# Patient Record
Sex: Male | Born: 1960 | Race: White | Hispanic: No | Marital: Single
Health system: Southern US, Community
[De-identification: ages and names within clinical notes are randomized; demographics above are authoritative.]

---

## 2003-03-10 ENCOUNTER — Other Ambulatory Visit: Payer: Self-pay

## 2003-03-12 ENCOUNTER — Other Ambulatory Visit: Payer: Self-pay

## 2003-03-13 ENCOUNTER — Other Ambulatory Visit: Payer: Self-pay

## 2004-06-04 ENCOUNTER — Emergency Department: Payer: Self-pay | Admitting: Unknown Physician Specialty

## 2004-06-04 ENCOUNTER — Other Ambulatory Visit: Payer: Self-pay

## 2004-06-05 ENCOUNTER — Other Ambulatory Visit: Payer: Self-pay

## 2004-06-17 ENCOUNTER — Ambulatory Visit: Payer: Self-pay | Admitting: Cardiology

## 2004-10-14 ENCOUNTER — Emergency Department: Payer: Self-pay | Admitting: Emergency Medicine

## 2005-06-04 ENCOUNTER — Ambulatory Visit: Payer: Self-pay | Admitting: Cardiology

## 2005-10-26 ENCOUNTER — Emergency Department (HOSPITAL_COMMUNITY): Admission: EM | Admit: 2005-10-26 | Discharge: 2005-10-26 | Payer: Self-pay | Admitting: Emergency Medicine

## 2006-07-12 ENCOUNTER — Emergency Department: Payer: Self-pay | Admitting: Internal Medicine

## 2006-07-13 ENCOUNTER — Inpatient Hospital Stay: Payer: Self-pay

## 2006-09-03 ENCOUNTER — Observation Stay: Payer: Self-pay | Admitting: Internal Medicine

## 2006-09-03 ENCOUNTER — Other Ambulatory Visit: Payer: Self-pay

## 2006-11-05 ENCOUNTER — Other Ambulatory Visit: Payer: Self-pay

## 2006-11-05 ENCOUNTER — Ambulatory Visit: Payer: Self-pay | Admitting: Cardiology

## 2006-11-05 ENCOUNTER — Inpatient Hospital Stay: Payer: Self-pay | Admitting: Cardiology

## 2006-11-07 ENCOUNTER — Other Ambulatory Visit: Payer: Self-pay

## 2006-11-08 ENCOUNTER — Other Ambulatory Visit: Payer: Self-pay

## 2006-11-17 ENCOUNTER — Other Ambulatory Visit: Payer: Self-pay

## 2006-11-17 ENCOUNTER — Ambulatory Visit: Payer: Self-pay | Admitting: Cardiology

## 2006-12-24 ENCOUNTER — Ambulatory Visit: Payer: Self-pay | Admitting: Cardiology

## 2006-12-24 ENCOUNTER — Other Ambulatory Visit: Payer: Self-pay

## 2007-01-09 ENCOUNTER — Emergency Department: Payer: Self-pay | Admitting: Emergency Medicine

## 2007-01-09 ENCOUNTER — Other Ambulatory Visit: Payer: Self-pay

## 2007-11-23 ENCOUNTER — Inpatient Hospital Stay: Payer: Self-pay | Admitting: Internal Medicine

## 2008-05-23 ENCOUNTER — Ambulatory Visit: Payer: Self-pay | Admitting: Internal Medicine

## 2008-06-11 ENCOUNTER — Ambulatory Visit: Payer: Self-pay | Admitting: Internal Medicine

## 2008-06-16 ENCOUNTER — Ambulatory Visit: Payer: Self-pay | Admitting: Internal Medicine

## 2008-07-26 ENCOUNTER — Inpatient Hospital Stay: Payer: Self-pay | Admitting: Psychiatry

## 2009-07-03 IMAGING — CR DG RIBS 2V*R*
1 series · 4 of 4 positions shown · non-contrast
Comparison: none

REASON FOR EXAM: trauma
COMMENTS:

[Series 1: view not recorded · 0.17mm/px · 4 of 4 slices shown]
[im 1/4]
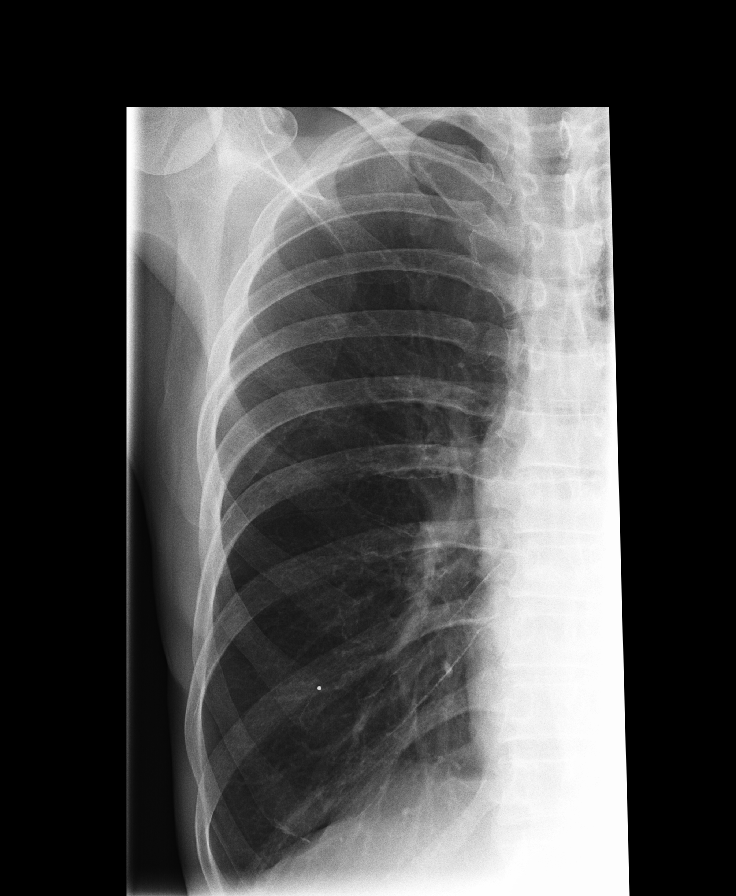
[im 2/4]
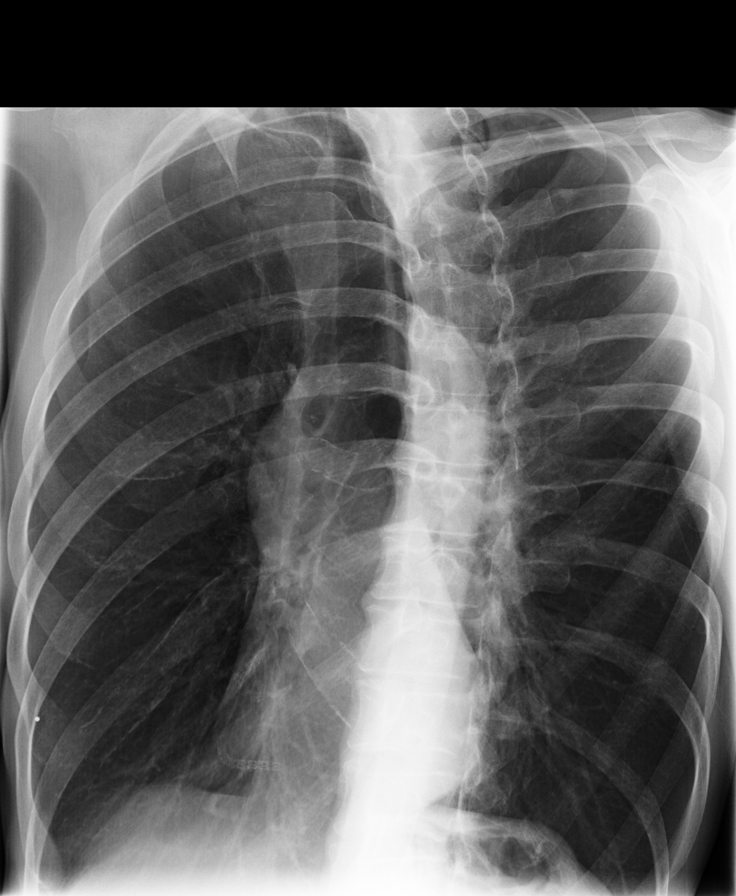
[im 3/4]
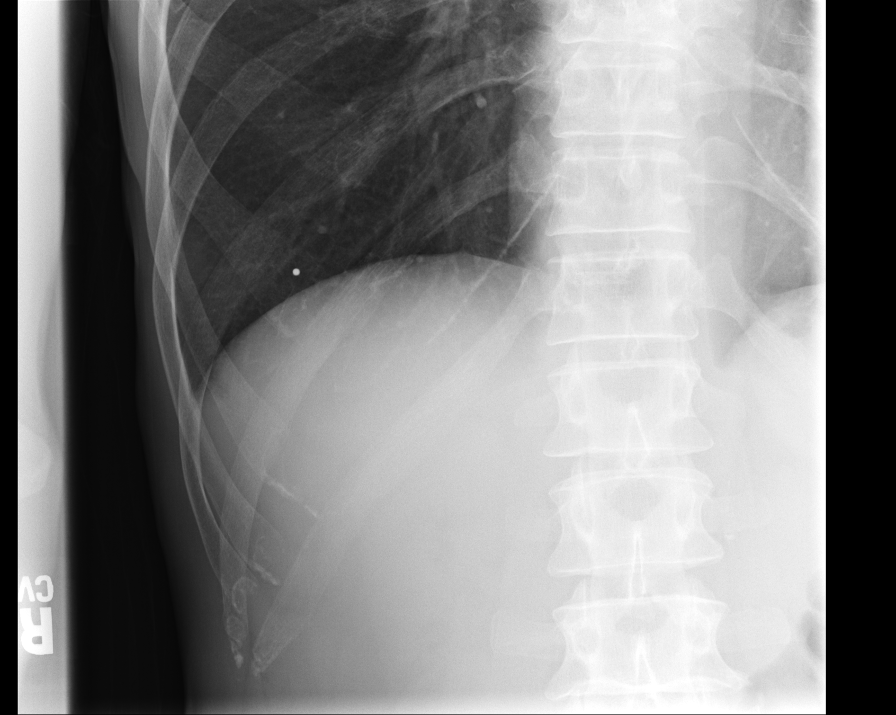
[im 4/4]
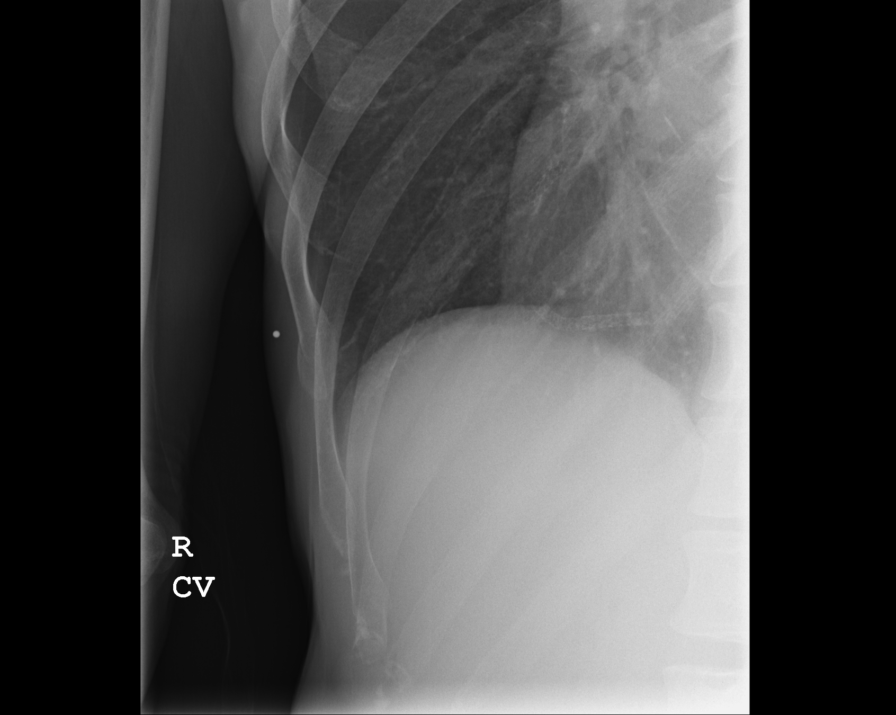

[4 of 4 positions shown; findings below may reference images not displayed]

PROCEDURE:     MDR - MDR RIBS RIGHT UNLILATERAL  - May 23, 2008 [DATE]

RESULT:     Multiple views of the ribs were obtained. In one view, there is
slight deformity of the lateral cortical margin of the seventh right rib.
This may represent an incomplete fracture. The finding, however, is not
identified in other views and may be entirely spurious. No other areas
suspicious for fracture are identified.
IMPRESSION: Possible nondisplaced fracture of the seventh left rib anteriorly just
lateral to its costal attachment. As noted above, the finding is seen only
one view and may be spurious. Correlation with clinical findings is needed.

## 2011-03-29 ENCOUNTER — Emergency Department: Payer: Self-pay | Admitting: Internal Medicine

## 2011-03-29 LAB — COMPREHENSIVE METABOLIC PANEL
Alkaline Phosphatase: 109 U/L (ref 50–136)
Calcium, Total: 9.2 mg/dL (ref 8.5–10.1)
Chloride: 104 mmol/L (ref 98–107)
Creatinine: 0.81 mg/dL (ref 0.60–1.30)
EGFR (African American): >60
EGFR (Non-African Amer.): >60
Glucose: 92 mg/dL (ref 65–99)
Osmolality: 280 (ref 275–301)
Sodium: 141 mmol/L (ref 136–145)
Total Protein: 7.5 g/dL (ref 6.4–8.2)

## 2011-03-29 LAB — CBC
HCT: 43.4 % (ref 40.0–52.0)
HGB: 14.7 g/dL (ref 13.0–18.0)
MCHC: 33.9 g/dL (ref 32.0–36.0)
MCV: 103 fL — ABNORMAL HIGH (ref 80–100)
Platelet: 260 10*3/uL (ref 150–440)
RDW: 15.7 % — ABNORMAL HIGH (ref 11.5–14.5)

## 2011-03-29 LAB — TROPONIN I: Troponin-I: <0.02 ng/mL

## 2011-05-27 ENCOUNTER — Inpatient Hospital Stay: Payer: Self-pay | Admitting: Internal Medicine

## 2011-05-27 LAB — CBC
HCT: 44.4 % (ref 40.0–52.0)
HGB: 14.7 g/dL (ref 13.0–18.0)
MCH: 34.8 pg — ABNORMAL HIGH (ref 26.0–34.0)
MCHC: 33.1 g/dL (ref 32.0–36.0)
MCV: 105 fL — ABNORMAL HIGH (ref 80–100)
Platelet: 286 10*3/uL (ref 150–440)
RBC: 4.23 10*6/uL — ABNORMAL LOW (ref 4.40–5.90)

## 2011-05-27 LAB — CK TOTAL AND CKMB (NOT AT ARMC)
CK, Total: 55 U/L (ref 35–232)
CK, Total: 62 U/L (ref 35–232)
CK-MB: 2.1 ng/mL (ref 0.5–3.6)

## 2011-05-27 LAB — URINALYSIS, COMPLETE
Blood: NEGATIVE
Leukocyte Esterase: NEGATIVE
Nitrite: NEGATIVE
RBC,UR: NONE SEEN /HPF (ref 0–5)
WBC UR: 1 /HPF (ref 0–5)

## 2011-05-27 LAB — BASIC METABOLIC PANEL
Anion Gap: 15 (ref 7–16)
BUN: 11 mg/dL (ref 7–18)
Chloride: 107 mmol/L (ref 98–107)
Creatinine: 0.94 mg/dL (ref 0.60–1.30)
EGFR (African American): >60
EGFR (Non-African Amer.): >60
Glucose: 209 mg/dL — ABNORMAL HIGH (ref 65–99)
Osmolality: 289 (ref 275–301)
Potassium: 3.6 mmol/L (ref 3.5–5.1)
Sodium: 142 mmol/L (ref 136–145)

## 2011-05-27 LAB — TROPONIN I: Troponin-I: 0.04 ng/mL

## 2011-05-28 LAB — BASIC METABOLIC PANEL
BUN: 11 mg/dL (ref 7–18)
Chloride: 106 mmol/L (ref 98–107)
Co2: 24 mmol/L (ref 21–32)
EGFR (African American): >60
EGFR (Non-African Amer.): >60
Osmolality: 276 (ref 275–301)
Potassium: 3.9 mmol/L (ref 3.5–5.1)

## 2011-05-28 LAB — CBC WITH DIFFERENTIAL/PLATELET
Basophil #: 0 10*3/uL (ref 0.0–0.1)
Eosinophil #: 0.1 10*3/uL (ref 0.0–0.7)
Eosinophil %: 0.6 %
HGB: 13.4 g/dL (ref 13.0–18.0)
Lymphocyte #: 1.5 10*3/uL (ref 1.0–3.6)
Lymphocyte %: 15.8 %
MCH: 34.9 pg — ABNORMAL HIGH (ref 26.0–34.0)
MCHC: 33.3 g/dL (ref 32.0–36.0)
MCV: 105 fL — ABNORMAL HIGH (ref 80–100)
Monocyte #: 0.7 x10 3/mm (ref 0.2–1.0)
Monocyte %: 7.3 %
Neutrophil #: 7.2 10*3/uL — ABNORMAL HIGH (ref 1.4–6.5)
Neutrophil %: 75.8 %
Platelet: 244 10*3/uL (ref 150–440)
RBC: 3.85 10*6/uL — ABNORMAL LOW (ref 4.40–5.90)

## 2011-05-28 LAB — PROTIME-INR
INR: 1
Prothrombin Time: 13.3 secs (ref 11.5–14.7)

## 2011-05-28 LAB — CK TOTAL AND CKMB (NOT AT ARMC)
CK, Total: 87 U/L (ref 35–232)
CK-MB: 4.1 ng/mL — ABNORMAL HIGH (ref 0.5–3.6)

## 2011-05-28 LAB — LIPID PANEL
Cholesterol: 150 mg/dL (ref 0–200)
HDL Cholesterol: 44 mg/dL (ref 40–60)
Triglycerides: 99 mg/dL (ref 0–200)

## 2012-05-08 IMAGING — CR DG CHEST 2V
1 series · 3 of 3 positions shown · non-contrast
Comparison: none

REASON FOR EXAM: chest pain
COMMENTS:   LMP: (Male)

[Series 1: w chest pa · 0.14mm/px · 3 of 3 slices shown]
[im 1/3]
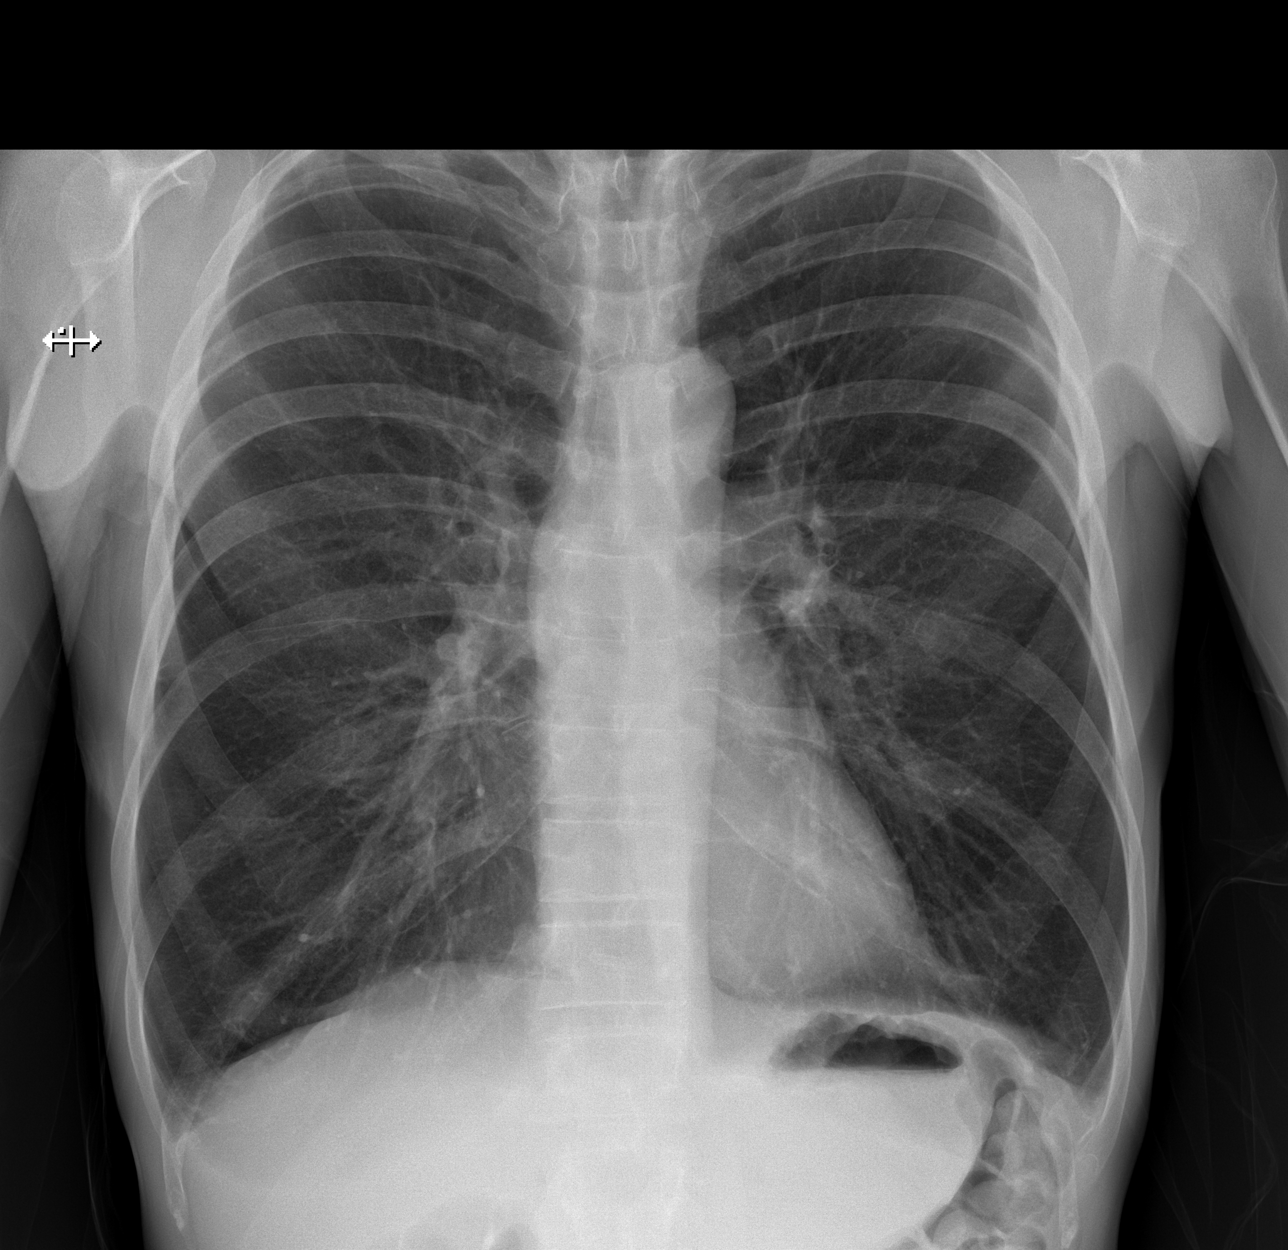
[im 2/3]
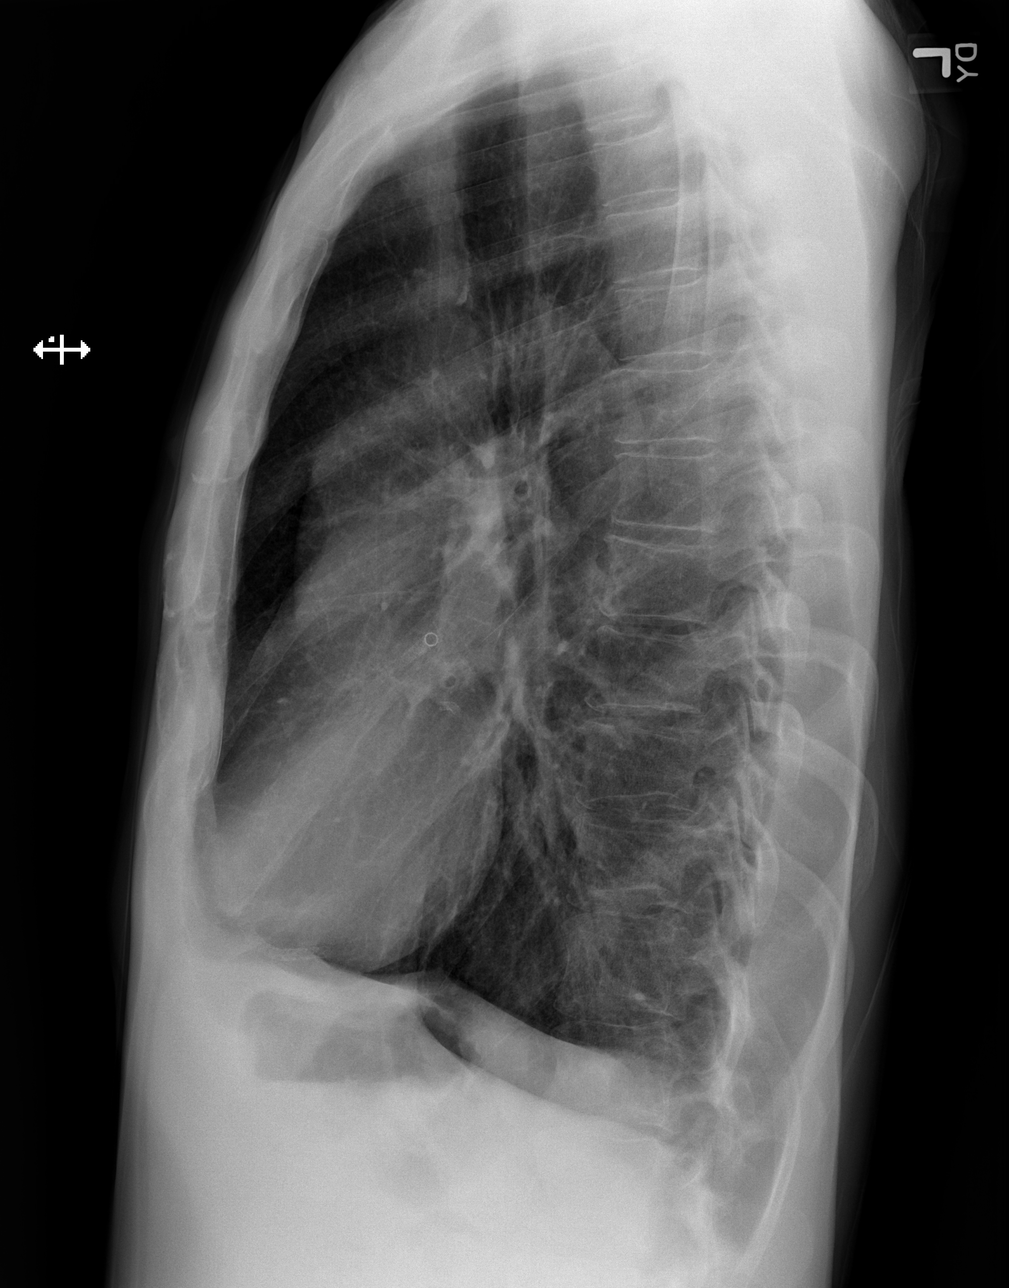
[im 3/3]
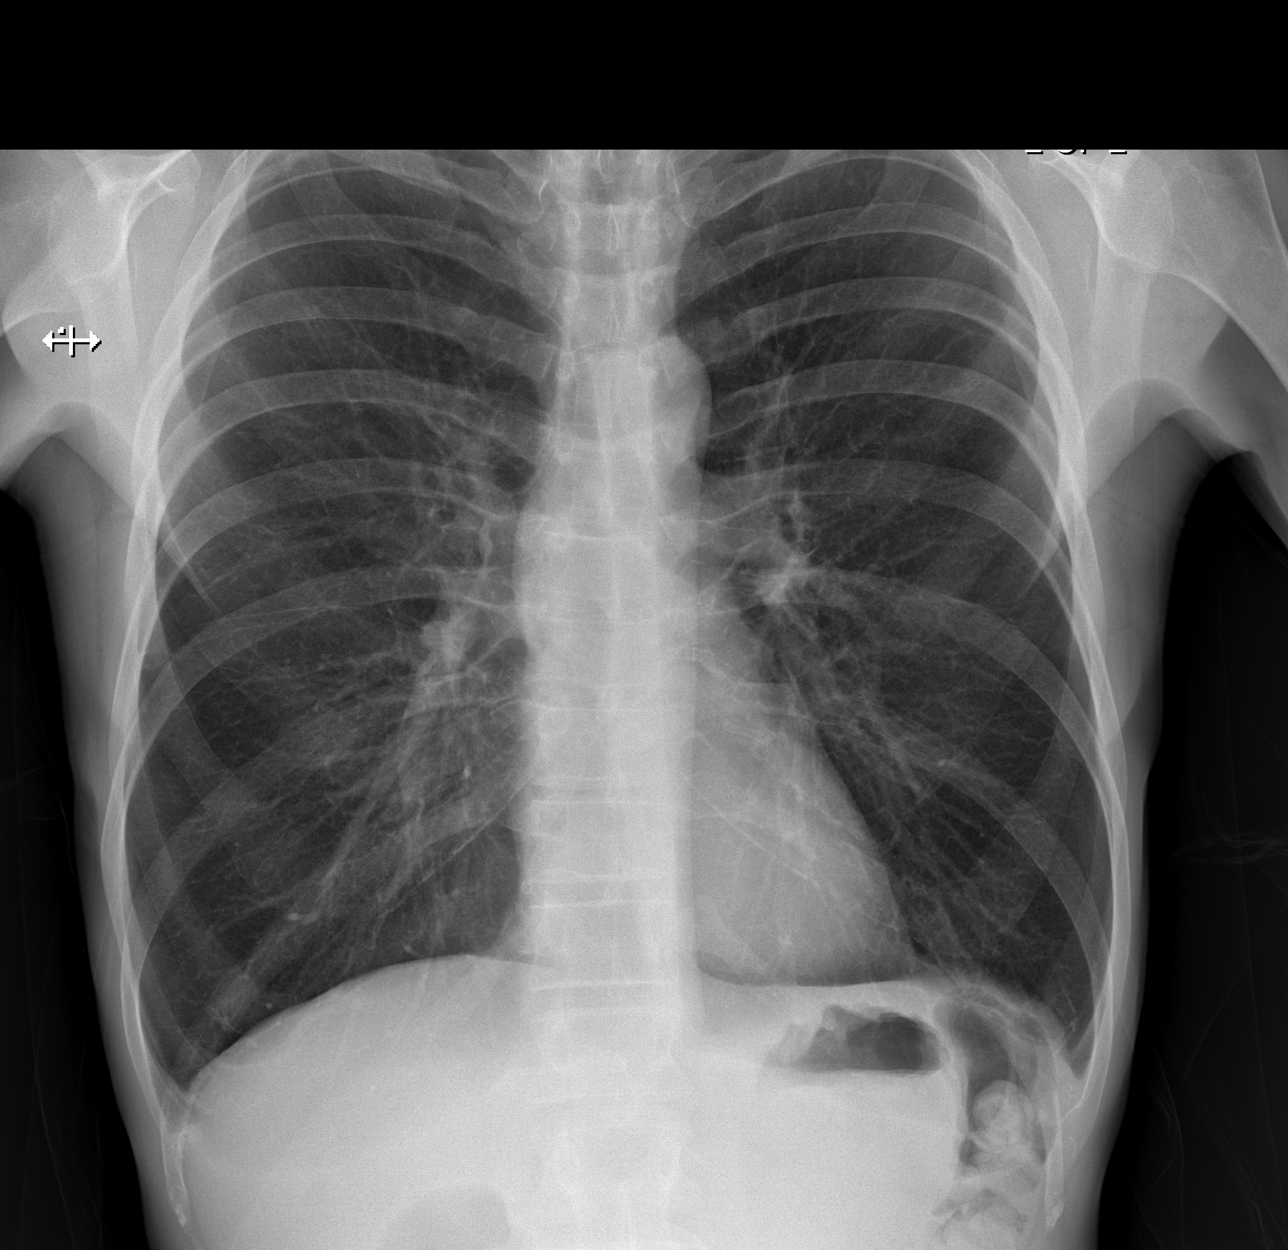

[3 of 3 positions shown; findings below may reference images not displayed]

PROCEDURE:     DXR - DXR CHEST PA (OR AP) AND LATERAL  - March 29, 2011  [DATE]

RESULT:     Comparison is made to the prior exam of 06/16/2008. The lung
fields are clear of infiltrate. No pneumonia, pneumothorax or pleural
effusion is seen. The chest is bilaterally hyperinflated consistent with a
history of COPD or asthma. The mediastinal and osseous structures show no
significant abnormalities.
IMPRESSION: 1. No acute changes are identified.
2. The lungs are bilaterally hyperinflated.

## 2012-05-09 ENCOUNTER — Emergency Department: Payer: Self-pay | Admitting: Emergency Medicine

## 2012-05-09 LAB — COMPREHENSIVE METABOLIC PANEL
Albumin: 3.9 g/dL (ref 3.4–5.0)
Alkaline Phosphatase: 164 U/L — ABNORMAL HIGH (ref 50–136)
Anion Gap: 7 (ref 7–16)
Calcium, Total: 8.7 mg/dL (ref 8.5–10.1)
Creatinine: 0.64 mg/dL (ref 0.60–1.30)
EGFR (Non-African Amer.): >60
Glucose: 91 mg/dL (ref 65–99)
Osmolality: 273 (ref 275–301)
Potassium: 4.2 mmol/L (ref 3.5–5.1)
SGOT(AST): 40 U/L — ABNORMAL HIGH (ref 15–37)
Total Protein: 7.6 g/dL (ref 6.4–8.2)

## 2012-05-09 LAB — DRUG SCREEN, URINE
Amphetamines, Ur Screen: NEGATIVE (ref ?–1000)
Barbiturates, Ur Screen: NEGATIVE (ref ?–200)
Cannabinoid 50 Ng, Ur ~~LOC~~: NEGATIVE (ref ?–50)
Tricyclic, Ur Screen: NEGATIVE (ref ?–1000)

## 2012-05-09 LAB — URINALYSIS, COMPLETE
Bacteria: NONE SEEN
Bilirubin,UR: NEGATIVE
Glucose,UR: NEGATIVE mg/dL (ref 0–75)
Ketone: NEGATIVE
Nitrite: NEGATIVE
Ph: 6 (ref 4.5–8.0)
RBC,UR: <1 /HPF (ref 0–5)
Specific Gravity: 1.004 (ref 1.003–1.030)
WBC UR: NONE SEEN /HPF (ref 0–5)

## 2012-05-09 LAB — CBC
HGB: 15.3 g/dL (ref 13.0–18.0)
MCH: 34.7 pg — ABNORMAL HIGH (ref 26.0–34.0)
MCHC: 33.9 g/dL (ref 32.0–36.0)
RBC: 4.4 10*6/uL (ref 4.40–5.90)
RDW: 14.7 % — ABNORMAL HIGH (ref 11.5–14.5)
WBC: 7.8 10*3/uL (ref 3.8–10.6)

## 2012-05-09 LAB — TSH: Thyroid Stimulating Horm: 2.56 u[IU]/mL

## 2012-12-05 DEATH — deceased

## 2014-04-27 NOTE — Consult Note (Signed)
PATIENT NAME:  Joshua [name], Joshua [name] MR#:  [id number] DATE OF BIRTH:  04-07-1960  DATE OF CONSULTATION:    REFERRING PHYSICIAN:  Rebecka Apley, MD CONSULTING PHYSICIAN:  Ardeen Fillers. Garnetta Buddy, MD  REASON FOR CONSULTATION: Detox.   HISTORY OF PRESENT ILLNESS: The patient is a 54 year old Caucasian male who presented to the ED requesting detox from alcohol, opioids and benzodiazepines. He reported that he has been drinking approximately 8 to 12 beers per day for the past 30+ years. He reported that he has also been buying pills, including Percocet and OxyContin, on a daily basis. He reported that he has been buying the pills from the streets and he is tired of using these pills, as he has been broke all the time. He reported that it is time to get straightened out. The patient reported that he is currently living with his mother and has been running out of his money. The patient stated that he feels that he has been experiencing opiate withdrawal symptoms, including muscle cramps and nausea. Reported that he was last admitted here in 2010 and after that, he remained sober for approximately 2 to 3 months. The patient reported that he is not having any depression, anxiety or paranoia. He reported that he came to the hospital as he felt that he needs help. He reported that he is not having any suicidal or homicidal ideations or plans. He is having difficulty with handling the withdrawal symptoms and he wants medication for the same. He reported that he had heart issues last year and he was prescribed Xanax 5 mg 2 pills daily, as he has so much tolerance to other medication and since he came to the hospital, he was not given any of the Xanax. He reported that he cannot sleep without the help of the Xanax. He was very much focused on getting the pills at this time. He appeared to be having some anxiety during the interview.   PAST PSYCHIATRIC HISTORY: The patient has history of admission to [facility] for narcotic  dependence and alcohol dependence. He reported that he is not seeing any psychiatrist at this time. He denied any outpatient therapy. He denied any history of suicide attempts in the past. The patient's previous psychotropic medications include trazodone, chloral hydrate and clonidine.   FAMILY HISTORY: The patient reported a history of OxyContin abuse in his family. No history of suicide attempts known.   PAST MEDICAL HISTORY:  1.  Coronary artery disease.  2.  History of myocardial infarction x 3. He has a history of cardiac stent placement.  3.  Chronic back pain leading to fainting.  4.  History of hypertension.   CURRENT OUTPATIENT MEDICATIONS: Aspirin 81 mg daily, metoprolol 25 mg daily, warfarin 7.5 mg on Monday, Wednesday and Friday. He reported that he currently follows with Dr. Darrold Junker at [facility].   ALLERGIES: No known drug allergies.   SUBSTANCE ABUSE HISTORY: The patient has long history of drinking for the past 30 years. He has also tried cocaine in the 1980s and smoked marijuana at that same time. He also uses opioids including Percocet and OxyContin at this time.   SOCIAL HISTORY: The patient was born and raised in [place], [place], by both his biological parents. He also has 1 sister and an adopted brother from his father's second marriage. He currently denied any history of physical or sexual abuse. He has completed 11th grade of education from [employer]. He never completed his GED. He has  worked as a Education administratorpainter for 32 years. His first marriage lasted for 13 years, but then his wife did not appreciate his drinking and they were divorced. He has a 56 yo son and a 56 year old daughter from his first marriage. His second married lasted for approximately 11 years. He is currently separated from his second wife.   LEGAL HISTORY: The patient has a history of DUI in 2003.   REVIEW OF SYSTEMS:   CONSTITUTIONAL: No fever or chills. Has lost some weight recently.   EYES: No double or blurred vision.  RESPIRATORY: No shortness of breath or cough.  CARDIOVASCULAR: No chest pain or orthopnea.  GASTROINTESTINAL: Complaining of abdominal pain, some nausea. No vomiting or diarrhea.  GENITOURINARY: No incontinence or frequency.  ENDOCRINE: No heat or cold intolerance.  LYMPHATIC: No anemia or easy bruising.  INTEGUMENTARY: No acne or rash.  MUSCULOSKELETAL: No muscle or joint pain.  NEUROLOGIC: Denies any tingling or weakness.   VITAL SIGNS: Temperature 98.2, pulse 71, respirations 20, blood pressure 120/89.   LABORATORY DATA: Glucose 91, BUN 6, creatinine 0.64, sodium 138, potassium 4.2, chloride 107, bicarbonate 24, anion gap 7, osmolality 273, calcium 8.7. Blood alcohol level 195. Protein 7.6, albumin 3.9. Bilirubin 0.3, alkaline phosphatase 164, AST 40, AST 27. Urine drug screen positive for benzodiazepines and opioids. WBC 7.8, RBC 4.4, hemoglobin 15.3, hematocrit 45.1, platelet count 195, MCV 102, MCH 34.7  MENTAL STATUS EXAMINATION: The patient is a moderately built male who appeared his stated age. He maintained fair eye contact. He was sitting in the bed. His mood was anxious. Affect was congruent. Thought process was logical, goal-directed. Thought content was nondelusional. He denied having any suicidal or homicidal ideations or plans. He demonstrated poor insight and judgment regarding his use of drugs or alcohol.   DIAGNOSTIC IMPRESSION:  AXIS I: Alcohol dependence. Opioid dependence.  AXIS II: None.  AXIS III: Please review the medical history.   TREATMENT PLAN:  1.  The patient is currently in the ED [facility] Unit of for treatment of his alcohol and opioid dependence.  2.  I will start him on opioid withdrawal protocol to help with the withdrawal symptoms.  3.  He will be referred [facility] for continuation of the treatment. He agreed with the plan. However, the staff later called and reported that the patient had signed out AGAINST  MEDICAL ADVICE and reported that he does not want to continue his treatment and will do it on his own. No new medications will be prescribed for the patient at this time.   Thank you for allowing me to participate in the care of this patient.   ____________________________ Ardeen FillersUzma S. Garnetta BuddyFaheem, MD usf:jm D: 05/10/2012 16:27:39 ET T: 05/10/2012 17:08:58 ET JOB#: [id number]  cc: Ardeen FillersUzma S. Garnetta BuddyFaheem, MD, <Dictator> Rhunette CroftUZMA S Earland Reish MD ELECTRONICALLY SIGNED 05/13/2012 9:03

## 2014-04-29 NOTE — Consult Note (Signed)
PATIENT NAME:  Joshua [name], Joshua [name] MR#:  [id number] DATE OF BIRTH:  Jul 05, 1960  DATE OF CONSULTATION:  05/28/2011  REFERRING PHYSICIAN:  Adrian SaranSital Mody, MD CONSULTING PHYSICIAN:  Marcina MillardAlexander Kenny Rea, MD  PRIMARY CARE PHYSICIAN: Vonita MossMark Crissman, MD   CHIEF COMPLAINT: Chest pain.   HISTORY OF PRESENT ILLNESS: The patient is a 54 year old gentleman with known complicated coronary artery disease, status post multiple prior coronary interventions. The patient recently has been experiencing recurrent episodes of chest pain. Yesterday, the patient experienced a 10 out of 10 chest discomfort with radiation to his left arm and jaw. [facility] Room where EKG was nondiagnostic. The patient was admitted to the coronary care unit and treated with intravenous nitroglycerin with relief of chest pain. The patient has a positive troponin of 0.26.   PAST MEDICAL HISTORY:  1. Status post inferior myocardial infarction on 09/22/1998.  2. Status post stent in the right coronary on 09/24/1998. 3. Status post stent distal RCA on 03/12/2003.  4. Status post Cypher [facility].  5. Status post bare-metal stent distal RCA 11/05/2006.  6. Status post Cypher stent proximal LAD 11/17/2006.  7. Hyperlipidemia.  8. Hypertension.  9. Tobacco abuse   MEDICATIONS:  Isosorbide mononitrate 30 mg 3 times a day. Metoprolol tartrate 50 mg b.i.d.  Aspirin 81 mg daily.  Xanax 0.5 mg at bedtime p.r.n.  Ambien 10 mg at bedtime p.r.n.   SOCIAL HISTORY: The patient currently lives with his mother. He smokes a pack of cigarettes a day. Drinks 4 to 5 beers a day.   FAMILY HISTORY: Positive for coronary artery disease.   REVIEW OF SYSTEMS: CONSTITUTIONAL: No fever or chills. EYES: No blurry vision. EARS: No hearing loss. RESPIRATORY: Shortness of breath. CARDIOVASCULAR: Chest pain as described above. GASTROINTESTINAL: No nausea, vomiting, or  diarrhea. GU: No dysuria or hematuria. ENDOCRINE: No polyuria or polydipsia. MUSCULOSKELETAL: No arthralgias or myalgias. NEUROLOGICAL: No focal muscle weakness or numbness. PSYCHOLOGICAL: No depression or anxiety.   PHYSICAL EXAMINATION:   VITAL SIGNS: blood pressure 117/80, pulse 76, respirations 25, temperature 98.3, pulse oximetry 99%.   HEENT: Pupils equal and reactive to light and accommodation.   NECK: Supple without thyromegaly.   LUNGS: Clear.   HEART: Normal jugular venous pressure. Normal point of maximal impulse. Regular rate and rhythm. Normal S1, S2. No appreciable gallop, murmur, or rub.   ABDOMEN: Soft and nontender. Pulses were diminished bilaterally.   MUSCULOSKELETAL: Normal muscle tone.   NEUROLOGIC: The patient is alert and oriented x3. Motor and sensory both grossly intact.   IMPRESSION: This is a 54 year old gentleman with known coronary artery disease as stated above with new onset chest pain with positive troponins consistent with non-ST elevation myocardial infarction.   RECOMMENDATIONS:  1. Agree with current therapy.    2. Proceed with cardiac catheterization with selective coronary arteriography. The risks, benefits, and alternatives were explained and informed written consent obtained.     ____________________________ Marcina MillardAlexander Krishika Bugge, MD ap:rbg D: 05/28/2011 08:20:06 ET [name]: 05/28/2011 12:46:57 ET JOB#: [id number]  cc: Marcina MillardAlexander Nicki Furlan, MD, <Dictator> Marcina MillardALEXANDER Caliegh Middlekauff MD ELECTRONICALLY SIGNED 06/18/2011 17:40

## 2014-04-29 NOTE — Discharge Summary (Signed)
PATIENT NAME:  Joshua [name], Joshua [name] MR#:  [id number] DATE OF BIRTH:  1960/05/29  DATE OF ADMISSION:  06-02-2011 DATE OF DISCHARGE:  05/29/2011  ADMITTING DIAGNOSIS: Chest pain, unstable angina.   DISCHARGE DIAGNOSES:  1. Non-Q-wave myocardial infarction, mild. Status post cardiac catheterization on 05/28/2011 by Dr. Darrold Junker revealing mildly reduced left ventricular function. Estimated ejection fraction of 44%, three-vessel coronary artery disease, with high-grade in-stent restenosis, proximal left anterior descending, 75% stenosis OM1, 75% stenosis mid posterior descending artery and 75% stenosis RPL.   2. Hyperlipidemia.  3. Tobacco abuse. 4. Back pain. 5. Acute bronchitis. 6. Underweight.   DISCHARGE CONDITION: Stable.   DISCHARGE MEDICATIONS:  1. Tylenol 650 mg p.o. every four hours as needed.  2. Alprazolam 0.5 mg orally at bedtime as needed.  3. Aspirin 81 mg p.o. daily.  4. Imdur 30 mg p.o. daily.  5. Metoprolol 25 mg p.o. twice daily.  6. Morphine 2 to 4 mg IV every four hours as needed.  7. Nicotine patch 21 mg transdermally daily.  8. Zocor 10 mg p.o. at bedtime.  9. Norco 5/325 mg 20 tablets every four hours as needed.  10. Nicotine oral inhaler one cartridge every one hour as needed.  11. Heparin 500 units subcutaneously every eight hours.  12. Azithromycin 250 mg p.o. daily for four more days.   CONSULTANT: Dr. Darrold Junker.   RADIOLOGIC STUDIES: Chest, portable single, 2011/06/02, showed hyperinflation, consistent with chronic obstructive pulmonary disease. No acute abnormality evident. Cardiac catheterization done on 05/28/2011 revealed analysis of regional contractile function demonstrated apical akinesis and diaphragmatic akinesis. Ejection fraction calculated by contrast ventriculography was 44%. Summary: Mildly reduced left ventricular function. Estimated ejection fraction is 44%. Three vessel coronary artery disease with high-grade in-stent stenosis proximal LAD, 75% stenosis  OM1, 75% stenosis in mid PDA and 75% stenosis of RPL.  HISTORY: The patient is a 54 year old Caucasian male with past medical history significant for history of coronary artery disease, history of smoking, ongoing, who presented to the hospital with complaints of chest pain. Please refer to Dr. Camillia Herter admission note on 02-Jun-2011.   PHYSICAL EXAMINATION: On arrival to the Emergency Room, the patient's temperature was 97.3, pulse 75, respiration rate 18, blood pressure 138/76, saturation was 99% on room air. In general, his physical exam did not show any significant abnormalities.   LABORATORY, RADIOLOGICAL AND DIAGNOSTIC DATA: EKG showed no ST elevations or depressions. No significant change since prior EKG he was noted. Lab data done on the day of admission, Jun 02, 2011, showed glucose of 209, otherwise BMP was unremarkable. However, the patient's bicarbonate level was 20. The patient's cardiac enzymes, first set, was negative. However, second set showed mild elevation of troponin to 0.27 with normal MB fraction as well as CK total and third set showed elevation of troponin to 0.26 with mild MB fraction elevation to 4.1 and CK total of 87. The patient's white blood cell count was slightly elevated to 15.5, hemoglobin 14.7, platelet count 286. Urinalysis was unremarkable.   HOSPITAL COURSE: The patient was admitted to the hospital. His cardiac enzymes were cycled. He was started on nitroglycerin IV as well as metoprolol, aspirin, nitroglycerin and Lovenox. Cardiology consultation with Dr. Darrold Junker was obtained and Dr. Darrold Junker took the patient to cardiac catheterization lab. After cardiac catheterization lab, it was obvious that the patient will need to be transferred to a tertiary care center for definite coronary artery bypass grafting and Dr. Darrold Junker arranged discharge to [facility] which will be accepting medical center  where the patient will be discharged today.   On the day of  discharge, the patient's vital signs: Temperature 98.1, pulse 50s to 60s, respiratory rate 12 to 23, blood pressure ranging from 99 to 121 systolic and 70s and 80s diastolic, oxygen saturation 98% on room air at rest.   In regards to tobacco abuse, the patient was counseled and he was agreeable for nicotine replacement therapy which was initiated.  For hyperlipidemia, the patient is to resume and continues his outpatient medications. I am sorry to say, apparently, the patient was not on any cholesterol medications in the past. However, he was here started on Zocor at 20 mg p.o. daily dose at bedtime. The patient's lipid panel done on 05/28/2011 revealed LDL of 86. The patient's cholesterol level 150, triglycerides 99 and HDL 44.   The patient was complaining of some back pains while in the hospital. He was given pain medications as needed.   The patient was noted to have acute bronchitis with cough as well as rales in his lungs. Chest x-ray did not show any pneumonia. However, the patient was started on antibiotic therapy as well as inhalation therapy with DuoNebs in the hospital. He is to continue antibiotics for four more days with Zithromax 250 mg p.o. daily dose to be started tomorrow on the 25th of May 2013. Sputum cultures were requested, however, not obtained during this hospitalization.   The patient is being discharged in stable condition with the above-mentioned medications and follow-up.   TIME SPENT: 40 minutes.    ____________________________ Katharina Caperima Kyland No, MD rv:ap D: 05/29/2011 15:06:39 ET [name]: 05/29/2011 15:51:54 ET JOB#: [id number]  cc: Katharina Caperima Lamoine Fredricksen, MD, <Dictator> Marcina MillardAlexander Paraschos, MD Hooria Gasparini MD ELECTRONICALLY SIGNED 06/06/2011 15:41

## 2014-04-29 NOTE — H&P (Signed)
PATIENT NAME:  Joshua [name], Joshua [name] MR#:  [id number] DATE OF BIRTH:  10/29/1960  DATE OF ADMISSION:  05/27/2011  PRIMARY CARE PHYSICIAN: Vonita Moss, MD   PRIMARY CARDIOLOGIST: Marcina Millard, MD   CHIEF COMPLAINT: Chest pain.  HISTORY OF PRESENT ILLNESS: The patient is a 54 year old male with known coronary artery disease status post seven stents and smoking dependence who presents with the above complaint. The patient has had intermittent chest pain for the past several months. He has gone to see Dr. Darrold Junker. Dr. Darrold Junker wanted him to get a cardiac catheterization, however, the patient refused. Today he is here due to 10 out of 10 substernal chest pain radiating to his left arm and jaw. He continued to have this chest pain despite three nitro and nitro patch here and some morphine. He says this is similar to his past heart attack. He had some diaphoresis. No shortness of breath associated with it.   REVIEW OF SYSTEMS: CONSTITUTIONAL: No fever, fatigue, weakness. EYES: No blurred or double vision, glaucoma or cataracts. ENT: No ear pain, hearing loss, seasonal allergies. RESPIRATORY: No cough, wheezing, hemoptysis. Positive COPD. CARDIOVASCULAR: Positive chest pain. No palpitations, syncope, orthopnea, edema, arrhythmia, dyspnea on exertion. GI: No nausea, vomiting, diarrhea, abdominal pain, melena, or ulcers. GU: No dysuria or hematuria. ENDOCRINE: No polyuria or polydipsia. HEME/LYMPH: No anemia, easy bruising. SKIN: No rash or lesions. MUSCULOSKELETAL: No limited activity. NEUROLOGIC: No history of CVA, TIA, or seizures. PSYCH: No history of anxiety or depression.   PAST MEDICAL HISTORY: Coronary artery disease status post three stents.   MEDICATIONS:  1. Xanax 0.5 mg at bedtime for sleep.  2. Aspirin 81 mg daily.  3. Imdur 30 mg t.i.d.  4. Metoprolol 50 b.i.d.  5. Nitroglycerin p.r.n. for chest pain. 6. Ambien p.r.n. for sleep 10 mg.   ALLERGIES: No known drug allergies.   PAST  SURGICAL HISTORY: None.   SOCIAL HISTORY: The patient smokes a pack a day. He drinks about four to five beers a day. No IV drug use.   FAMILY HISTORY: Positive for coronary artery disease.   PHYSICAL EXAMINATION:   VITAL SIGNS: Temperature 97.3, pulse 75, respirations 18, blood pressure 138/76, 99% on room air.   GENERAL: The patient is alert, oriented, not in acute distress.   HEENT: Head is atraumatic. Pupils are round and reactive. Sclerae anicteric. Mucous membranes are moist. Oropharynx is clear.   NECK: Supple without JVD, carotid bruit, or enlarged thyroid.   CARDIOVASCULAR: Regular rhythm. No murmurs, gallops, or rubs. PMI is nondisplaced.   LUNGS: Clear to auscultation bilaterally without crackles, rales, rhonchi, or wheezing. Normal percussion.   ABDOMEN: Bowel sounds are present. Nontender, nondistended. No hepatosplenomegaly.  EXTREMITIES: No clubbing, cyanosis, or edema.   NEUROLOGIC: Cranial nerves II through XII are intact. No focal deficits.   SKIN: Without rash or lesions.   LABORATORY, DIAGNOSTIC, AND RADIOLOGICAL DATA: White blood cells 15.5, hemoglobin 14.7, hematocrit 44.4, platelets 286, sodium 142, potassium 3.6, chloride 107, bicarb 20, BUN 11, creatinine 0.94, glucose 209, calcium 8.6. Troponin 0.04. CK 55. CK-MB 1.4.   EKG shows no ST elevations or depressions.   Chest x-ray shows no acute cardiopulmonary disease.   ASSESSMENT AND PLAN: This is a 54 year old male with known coronary artery disease status post three stents with ongoing unstable angina.  1. Unstable angina. The patient still has chest pain. The patient will be admitted to CCU on a nitroglycerin drip. Continue Imdur, metoprolol, and aspirin. Add statin. I have also consulted Dr.  Paraschos and spoken with Dr. Darrold JunkerParaschos. The patient will need a cath for the a.m. more than likely.  2. Leukocytosis. Will check a urinalysis. Chest x-ray does not show acute infiltrate.  3. Hyperglycemia. Will  repeat a BMP in the a.m. If it is still elevated, further work-up may be performed.  4. Smoking dependence. I encouraged the patient to stop smoking. He is not interested at this time. I will place a nicotine patch. The patient was counseled for three minutes.   CODE STATUS: The patient is FULL CODE status.    TIME SPENT: Approximately 45 minutes.  ____________________________ Janyth ContesSital P. Juliene PinaMody, MD spm:drc D: 05/27/2011 16:07:59 ET T: 05/27/2011 16:33:36 ET JOB#: [id number]  cc: Jacolby Risby P. Juliene PinaMody, MD, <Dictator> Steele SizerMark A. Crissman, MD Marcina MillardAlexander Paraschos, MD Janyth ContesSITAL P Aquarius Tremper MD ELECTRONICALLY SIGNED 05/27/2011 20:48
